# Patient Record
Sex: Male | Born: 1960 | Race: White | Hispanic: No | Marital: Married | Smoking: Never smoker
Health system: Southern US, Community
[De-identification: ages and names within clinical notes are randomized; demographics above are authoritative.]

## PROBLEM LIST (undated history)

## (undated) HISTORY — PX: HERNIA REPAIR: SHX51

---

## 2014-07-13 ENCOUNTER — Emergency Department
Admission: EM | Admit: 2014-07-13 | Discharge: 2014-07-13 | Disposition: A | Discharge status: Home / Self Care | Payer: Federal, State, Local not specified - PPO | Source: Home / Self Care | Attending: Family Medicine | Admitting: Family Medicine

## 2014-07-13 ENCOUNTER — Encounter: Payer: Self-pay | Admitting: *Deleted

## 2014-07-13 DIAGNOSIS — R6889 Other general symptoms and signs: Secondary | ICD-10-CM | POA: Diagnosis not present

## 2014-07-13 MED ORDER — BENZONATATE 200 MG PO CAPS: 200.0000 mg | ORAL_CAPSULE | Freq: Every day | ORAL | Status: DC

## 2014-07-13 MED ORDER — OSELTAMIVIR PHOSPHATE 75 MG PO CAPS: 75.0000 mg | ORAL_CAPSULE | Freq: Two times a day (BID) | ORAL | Status: DC

## 2014-07-13 NOTE — ED Provider Notes (Signed)
CSN: [id number]     Arrival date & time 07/13/14  1643 History   First MD Initiated Contact with Patient 07/13/14 1704     Chief Complaint  Patient presents with  . Fever  . Cough      HPI Comments: Complains of 3 day history of fatigue, and today developed flu-like illness including myalgias, headache, fever/chills, and cough.  Also has mild nasal congestion and sore throat.  Cough is non-productive.  No pleuritic pain or shortness of breath.  He has not had a flu shot this season.  He has had mild nausea without vomiting.  His wife and daughter have influenza.  The history is provided by the patient.    History reviewed. No pertinent past medical history. Past Surgical History  Procedure Laterality Date  . Hernia repair     Family History  Problem Relation Age of Onset  . Cancer Father     liver   History  Substance Use Topics  . Smoking status: Never Smoker   . Smokeless tobacco: Not on file  . Alcohol Use: No    Review of Systems No sore throat + hoarse + coughin + wheezing + nasal congestion + dizziness No pleuritic pain  + post-nasal drainage No sinus pain/pressure No itchy/red eyes No earache No hemoptysis + SOB + fever, + chills + nausea No vomiting + abdominal pain No diarrhea No urinary symptoms No skin rash + fatigue + myalgias + headache Used OTC meds without relief  Allergies  Review of patient's allergies indicates no known allergies.  Home Medications   Prior to Admission medications   Medication Sig Start Date End Date Taking? Authorizing Provider  benzonatate (TESSALON) 200 MG capsule Take 1 capsule (200 mg total) by mouth at bedtime. Take as needed for cough 07/13/14   Lattie Haw, MD  oseltamivir (TAMIFLU) 75 MG capsule Take 1 capsule (75 mg total) by mouth every 12 (twelve) hours. 07/13/14   Lattie Haw, MD   BP 162/91 mmHg  Pulse 69  Temp(Src) 98.6 F (37 C) (Oral)  Resp 18  Ht  (1.803 m)  Wt 193 lb (87.544 kg)   BMI 26.93 kg/m2  SpO2 98% Physical Exam Nursing notes and Vital Signs reviewed. Appearance:  Patient appears stated age, and in no acute distress Eyes:  Pupils are equal, round, and reactive to light and accomodation.  Extraocular movement is intact.  Conjunctivae are not inflamed  Ears:  Canals normal.  Tympanic membranes normal.  Nose:  Mildly congested turbinates.  No sinus tenderness.  Pharynx:  Normal Neck:  Supple.  Enlarged but nontender posterior nodes are palpated bilaterally  Lungs:  Clear to auscultation.  Breath sounds are equal.  Heart:  Regular rate and rhythm without murmurs, rubs, or gallops.  Abdomen:  Nontender without masses or hepatosplenomegaly.  Bowel sounds are present.  No CVA or flank tenderness.  Extremities:  No edema.  No calf tenderness Skin:  No rash present.   ED Course  Procedures  none     MDM   1. Influenza-like symptoms    Begin Tamiflu.  Prescription written for Benzonatate [facility]) to take at bedtime for night-time cough.  Take plain guaifenesin (  extended release tabs such as Mucinex) twice daily, with plenty of water, for cough and congestion.  May add Pseudoephedrine for sinus congestion.  Get adequate rest.   May use Afrin nasal spray (or generic oxymetazoline) twice daily for about 5 days.  Also recommend using saline nasal  spray several times daily and saline nasal irrigation (AYR is a common brand).   Try warm salt water gargles for sore throat.  Stop all antihistamines for now, and other non-prescription cough/cold preparations. May take Ibuprofen 200mg , 4 tabs every 8 hours with food for headache, body aches, etc.   Follow-up with family doctor if not improving about one week.    Lattie HawStephen A Crystalina Stodghill, MD 07/17/14 [phone]

## 2014-07-13 NOTE — Discharge Instructions (Signed)
Take plain guaifenesin (1200mg  extended release tabs such as Mucinex) twice daily, with plenty of water, for cough and congestion.  May add Pseudoephedrine for sinus congestion.  Get adequate rest.   May use Afrin nasal spray (or generic oxymetazoline) twice daily for about 5 days.  Also recommend using saline nasal spray several times daily and saline nasal irrigation (AYR is a common brand).   Try warm salt water gargles for sore throat.  Stop all antihistamines for now, and other non-prescription cough/cold preparations. May take Ibuprofen 200mg , 4 tabs every 8 hours with food for headache, body aches, etc.   Follow-up with family doctor if not improving about one week.

## 2014-07-13 NOTE — ED Notes (Signed)
Pt cough, fever, and body aches x today. Reports wife and daughter with flu.

## 2016-03-06 ENCOUNTER — Encounter: Payer: Self-pay | Admitting: *Deleted

## 2016-03-06 ENCOUNTER — Emergency Department
Admission: EM | Admit: 2016-03-06 | Discharge: 2016-03-06 | Disposition: A | Discharge status: Home / Self Care | Payer: Federal, State, Local not specified - PPO | Source: Home / Self Care | Attending: Family Medicine | Admitting: Family Medicine

## 2016-03-06 DIAGNOSIS — B9789 Other viral agents as the cause of diseases classified elsewhere: Secondary | ICD-10-CM

## 2016-03-06 DIAGNOSIS — J069 Acute upper respiratory infection, unspecified: Secondary | ICD-10-CM

## 2016-03-06 MED ORDER — PREDNISONE 20 MG PO TABS: ORAL_TABLET | ORAL | 0 refills | Status: DC

## 2016-03-06 NOTE — ED Provider Notes (Signed)
[facility]    CSN: [id number] Arrival date & time: 03/06/16  1453     History   Chief Complaint Chief Complaint  Patient presents with  . Nasal Congestion    HPI Jeremiah Richardson is a 55 y.o. male.   Patient complains of three day history of typical cold-like symptoms developing over several days, including sinus congestion, headache, fatigue, chills, myalgias, and cough.  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                                                                                                                                                                           The history is provided by the patient.    History reviewed. No pertinent past medical history.  There are no active problems to display for this patient.   Past Surgical History:  Procedure Laterality Date  . HERNIA REPAIR         Home Medications    Prior to Admission medications   Medication Sig Start Date End Date Taking? Authorizing Provider  predniSONE (DELTASONE) 20 MG tablet Take one tab by mouth twice daily for 5 days, then one daily. Take with food. 03/06/16   Lattie HawStephen A Beese, MD    Family History Family History  Problem Relation Age of Onset  . Cancer Father     liver  . Diabetes Father     Social History Social History  Substance Use Topics  . Smoking status: Never Smoker  . Smokeless tobacco: Never Used  . Alcohol use No     Allergies   Review of patient's allergies indicates no known allergies.   Review of Systems Review of Systems No sore throat + cough No pleuritic pain + wheezing + nasal congestion + post-nasal drainage No sinus pain/pressure No itchy/red eyes No earache No hemoptysis No SOB No fever, + chills No nausea No vomiting No abdominal pain No diarrhea No urinary symptoms No skin rash + fatigue + myalgias + headache    Physical Exam Triage  Vital Signs ED Triage Vitals  Enc Vitals Group     BP 03/06/16 1517 147/86     Pulse Rate 03/06/16 1517 (!) 56     Resp 03/06/16 1517 18     Temp 03/06/16 1517 97.5 F (36.4 C)     Temp Source 03/06/16 1517 Oral     SpO2 03/06/16 1517 99 %     Weight 03/06/16 1517 194 lb (88 kg)     Height 03/06/16 1517 5\' 11"  (1.803 m)     Head Circumference --      Peak Flow --  Pain Score 03/06/16 1518 0     Pain Loc --      Pain Edu? --      Excl. in GC? --    No data found.   Updated Vital Signs BP 147/86 (BP Location: Left Arm)   Pulse (!) 56   Temp 97.5 F (36.4 C) (Oral)   Resp 18   Ht 5\' 11"  (1.803 m)   Wt 194 lb (88 kg)   SpO2 99%   BMI 27.06 kg/m   Visual Acuity Right Eye Distance:   Left Eye Distance:   Bilateral Distance:    Right Eye Near:   Left Eye Near:    Bilateral Near:     Physical Exam Nursing notes and Vital Signs reviewed. Appearance:  Patient appears stated age, and in no acute distress Eyes:  Pupils are equal, round, and reactive to light and accomodation.  Extraocular movement is intact.  Conjunctivae are not inflamed  Ears:  Canals normal.  Tympanic membranes normal.  Nose:  Mildly congested turbinates.  No sinus tenderness.    Pharynx:  Normal Neck:  Supple.  Slightly tender enlarged posterior/lateral nodes are palpated bilaterally  Lungs:  Clear to auscultation.  Breath sounds are equal.  Moving air well. Heart:  Regular rate and rhythm without murmurs, rubs, or gallops.  Abdomen:  Nontender without masses or hepatosplenomegaly.  Bowel sounds are present.  No CVA or flank tenderness.  Extremities:  No edema.  Skin:  No rash present.    UC Treatments / Results  Labs (all labs ordered are listed, but only abnormal results are displayed) Labs Reviewed - No data to display  EKG  EKG Interpretation None       Radiology No results found.  Procedures Procedures (including critical care time)  Medications Ordered in UC Medications -  No data to display   Initial Impression / Assessment and Plan / UC Course  I have reviewed the triage vital signs and the nursing notes.  Pertinent labs & imaging results that were available during my care of the patient were reviewed by me and considered in my medical decision making (see chart for details).  Clinical Course  There is no evidence of bacterial infection today.   Begin prednisone burst/taper. Take plain guaifenesin (1200mg  extended release tabs such as Mucinex) twice daily, with plenty of water, for cough and congestion.  May add Pseudoephedrine (30mg , one or two every 4 to 6 hours) for sinus congestion.  Get adequate rest.   May use Afrin nasal spray (or generic oxymetazoline) twice daily for about 5 days and then discontinue.  Also recommend using saline nasal spray several times daily and saline nasal irrigation (AYR is a common brand).  Use Flonase nasal spray each morning after using Afrin nasal spray and saline nasal irrigation. Try warm salt water gargles for sore throat.  Stop all antihistamines for now, and other non-prescription cough/cold preparations. May take Delsym Cough Suppressant at bedtime for nighttime cough.  Followup with Family Doctor if not improved in about 10 days.     Final Clinical Impressions(s) / UC Diagnoses   Final diagnoses:  Viral URI with cough    New Prescriptions New Prescriptions   PREDNISONE (DELTASONE) 20 MG TABLET    Take one tab by mouth twice daily for 5 days, then one daily. Take with food.     Lattie HawStephen A Beese, MD 03/06/16 [phone]

## 2016-03-06 NOTE — ED Triage Notes (Signed)
Pt c/o watery eyes, nasal congestion and body aches x 3 days. Denies fever.

## 2016-03-06 NOTE — Discharge Instructions (Signed)
Take plain guaifenesin (1200mg extended release tabs such as Mucinex) twice daily, with plenty of water, for cough and congestion.  May add Pseudoephedrine (30mg, one or two every 4 to 6 hours) for sinus congestion.  Get adequate rest.   °May use Afrin nasal spray (or generic oxymetazoline) twice daily for about 5 days and then discontinue.  Also recommend using saline nasal spray several times daily and saline nasal irrigation (AYR is a common brand).  Use Flonase nasal spray each morning after using Afrin nasal spray and saline nasal irrigation. °Try warm salt water gargles for sore throat.  °Stop all antihistamines for now, and other non-prescription cough/cold preparations. °May take Delsym Cough Suppressant at bedtime for nighttime cough.  °Follow-up with family doctor if not improving about10 days.  °

## 2016-06-11 ENCOUNTER — Emergency Department
Admission: EM | Admit: 2016-06-11 | Discharge: 2016-06-11 | Disposition: A | Discharge status: Home / Self Care | Payer: Federal, State, Local not specified - PPO | Source: Home / Self Care | Attending: Family Medicine | Admitting: Family Medicine

## 2016-06-11 ENCOUNTER — Encounter: Payer: Self-pay | Admitting: Emergency Medicine

## 2016-06-11 DIAGNOSIS — J069 Acute upper respiratory infection, unspecified: Secondary | ICD-10-CM

## 2016-06-11 DIAGNOSIS — B9789 Other viral agents as the cause of diseases classified elsewhere: Secondary | ICD-10-CM | POA: Diagnosis not present

## 2016-06-11 MED ORDER — PREDNISONE 20 MG PO TABS: ORAL_TABLET | ORAL | 0 refills | Status: DC

## 2016-06-11 MED ORDER — OSELTAMIVIR PHOSPHATE 75 MG PO CAPS: 75.0000 mg | ORAL_CAPSULE | Freq: Two times a day (BID) | ORAL | 0 refills | Status: DC

## 2016-06-11 MED ORDER — FLUTICASONE PROPIONATE 50 MCG/ACT NA SUSP: 2.0000 | Freq: Every day | NASAL | 2 refills | Status: DC

## 2016-06-11 MED ORDER — ALBUTEROL SULFATE HFA 108 (90 BASE) MCG/ACT IN AERS: 1.0000 | INHALATION_SPRAY | Freq: Four times a day (QID) | RESPIRATORY_TRACT | 0 refills | Status: DC | PRN

## 2016-06-11 NOTE — ED Provider Notes (Signed)
CSN: [id number]     Arrival date & time 06/11/16  0807 History   First MD Initiated Contact with Patient 06/11/16 0920     Chief Complaint  Patient presents with  . Cough   (Consider location/radiation/quality/duration/timing/severity/associated sxs/prior Treatment) HPI Jeremiah Richardson is a 56 y.o. male presenting to UC with c/o dry cough, body aches, and fullness in his chest that started yesterday. Symptoms are mild at this time but he had a temp of 99.6*F this morning, his wife insisted he be seen to make sure symptoms don't worsen. Denies n/v/d. 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 No specific known sick contacts but others at work have been out due to strep and the flu.   History reviewed. No pertinent past medical history. Past Surgical History:  Procedure Laterality Date  . HERNIA REPAIR     Family History  Problem Relation Age of Onset  . Cancer Father     liver  . Diabetes Father    Social History  Substance Use Topics  . Smoking status: Never Smoker  . Smokeless tobacco: Never Used  . Alcohol use No    Review of Systems  Constitutional: Positive for fever ( low grade). Negative for chills.  HENT: Positive for congestion. Negative for ear pain, sore throat, trouble swallowing and voice change.   Respiratory: Positive for cough. Negative for shortness of breath.   Cardiovascular: Negative for chest pain and palpitations.  Gastrointestinal: Negative for abdominal pain, diarrhea, nausea and vomiting.  Musculoskeletal: Positive for arthralgias, back pain and myalgias.       Body aches  Skin: Negative for rash.  Neurological: Positive for headaches. Negative for dizziness and light-headedness.    Allergies  Patient has no known allergies.  Home Medications   Prior to Admission medications   Medication Sig Start Date End Date  Taking? Authorizing Provider  albuterol (PROVENTIL HFA;VENTOLIN HFA) 108 (90 Base) MCG/ACT inhaler Inhale 1-2 puffs into the lungs every 6 (six) hours as needed for wheezing or shortness of breath. 06/11/16   Junius FinnerErin O'Malley, PA-C  fluticasone (FLONASE) 50 MCG/ACT nasal spray Place 2 sprays into both nostrils daily. 06/11/16   Junius FinnerErin O'Malley, PA-C  oseltamivir (TAMIFLU) 75 MG capsule Take 1 capsule (75 mg total) by mouth every 12 (twelve) hours. 06/11/16   Junius FinnerErin O'Malley, PA-C  predniSONE (DELTASONE) 20 MG tablet Take one tab by mouth twice daily for 5 days, then one daily. Take with food. 03/06/16   Lattie HawStephen A Beese, MD  predniSONE (DELTASONE) 20 MG tablet 3 tabs po day one, then 2 po daily x 4 days 06/11/16   Junius FinnerErin O'Malley, PA-C   Meds Ordered and Administered this Visit  Medications - No data to display  BP 167/92 (BP Location: Right Arm)   Pulse 76   Temp 98.3 F (36.8 C) (Oral)   Wt 196 lb (88.9 kg)   SpO2 99%   BMI 27.34 kg/m  No data found.   Physical Exam  Constitutional: He is oriented to person, place, and time. He appears well-developed and well-nourished. No distress.  HENT:  Head: Normocephalic and atraumatic.  Right Ear: Tympanic membrane normal.  Left Ear: Tympanic membrane normal.  Nose: Nose normal.  Mouth/Throat: Uvula is midline, oropharynx is clear and moist and mucous membranes are normal.  Eyes: EOM are normal.  Neck: Normal  range of motion. Neck supple.  Cardiovascular: Normal rate and regular rhythm.   Pulmonary/Chest: Effort normal and breath sounds normal. No stridor. No respiratory distress. He has no wheezes. He has no rales.  Musculoskeletal: Normal range of motion.  Lymphadenopathy:    He has no cervical adenopathy.  Neurological: He is alert and oriented to person, place, and time.  Skin: Skin is warm and dry. He is not diaphoretic.  Psychiatric: He has a normal mood and affect. His behavior is normal.  Nursing note and vitals reviewed.   Urgent Care Course      Procedures (including critical care time)  Labs Review Labs Reviewed - No data to display  Imaging Review No results found.   MDM   1. Viral URI with cough    Pt presenting to UC with URI symptoms that started yesterday.  Pt notes he does have mild body aches but no high fever or productive cough.   Symptoms could be due to viral illness such as common cold, however, given time of year and busy flu season, discussed risk/benefits of Tamiflu for potential early onset influenza.    Pt agreeable to try if symptoms worsen tomorrow. Encouraged fluids, rest, acetaminophen, ibuprofen and mucinex.  Due to hx of reactive airway- Rx: Prednisone and albuterol F/u with PCP in 1 week if not improving.   Junius Finner, PA-C 06/11/16 1018

## 2016-06-11 NOTE — Discharge Instructions (Signed)

## 2016-06-11 NOTE — ED Triage Notes (Signed)
Pt c/o dry cough, body aches and chest feels full. This started yesterday.

## 2016-06-13 ENCOUNTER — Telehealth: Payer: Self-pay | Admitting: Emergency Medicine

## 2016-06-13 NOTE — Telephone Encounter (Signed)
Called to report higher fever today; wanting to know if too late to get tamiflu rx filled. I suggested he do so and start immediately per directions.

## 2017-01-20 ENCOUNTER — Ambulatory Visit: Payer: Federal, State, Local not specified - PPO | Admitting: Physician Assistant

## 2017-02-25 ENCOUNTER — Emergency Department
Admission: EM | Admit: 2017-02-25 | Discharge: 2017-02-25 | Disposition: A | Discharge status: Home / Self Care | Payer: Federal, State, Local not specified - PPO | Source: Home / Self Care | Attending: Family Medicine | Admitting: Family Medicine

## 2017-02-25 ENCOUNTER — Encounter: Payer: Self-pay | Admitting: *Deleted

## 2017-02-25 DIAGNOSIS — R1033 Periumbilical pain: Secondary | ICD-10-CM

## 2017-02-25 LAB — POCT CBC W AUTO DIFF (K'VILLE URGENT CARE)

## 2017-02-25 MED ORDER — CIPROFLOXACIN HCL 500 MG PO TABS: 500.0000 mg | ORAL_TABLET | Freq: Two times a day (BID) | ORAL | 0 refills | Status: DC

## 2017-02-25 MED ORDER — METRONIDAZOLE 500 MG PO TABS: 500.0000 mg | ORAL_TABLET | Freq: Three times a day (TID) | ORAL | 0 refills | Status: DC

## 2017-02-25 NOTE — Discharge Instructions (Signed)
Begin clear liquids for about 24 hours, then may begin a BRAT diet (Bananas, Rice, Applesauce, Toast) when abdominal pain improved.  Then gradually advance to a regular diet as tolerated.  Avoid milk products until well.   If symptoms become significantly worse during the night or over the weekend, proceed to the local emergency room.

## 2017-02-25 NOTE — ED Provider Notes (Signed)
[facility]    CSN: [id number] Arrival date & time: 02/25/17  1231     History   Chief Complaint Chief Complaint  Patient presents with  . Diarrhea  . Nasal Congestion    HPI Jeremiah Richardson is a 56 y.o. male.   Patient developed a mild sore throat, sinus congestion, chills/sweats, and myalgias four days ago.  His sore throat and sinus congestion resolved.  Last night before work he developed loose stools and took Imodium at about 7pm.  He later developed lower abdominal pain that has now improved.  He had nausea this morning without vomiting.  His diarrhea has resolved. No urinary symptoms.  He states that his daughter had similar symptoms about one week ago.   The history is provided by the patient.  Diarrhea  Quality:  Watery Severity:  Mild Onset quality:  Sudden Timing:  Intermittent Progression:  Resolved Relieved by:  Anti-motility medications Worsened by:  Nothing Ineffective treatments:  None tried Associated symptoms: abdominal pain, chills, diaphoresis, headaches and myalgias   Associated symptoms: no arthralgias, no recent cough and no vomiting   Risk factors: sick contacts     History reviewed. No pertinent past medical history.  There are no active problems to display for this patient.   Past Surgical History:  Procedure Laterality Date  . HERNIA REPAIR         Home Medications    Prior to Admission medications   Medication Sig Start Date End Date Taking? Authorizing Provider  ciprofloxacin (CIPRO) 500 MG tablet Take 1 tablet (500 mg total) by mouth 2 (two) times daily. 02/25/17   Lattie Haw, MD  fluticasone (FLONASE) 50 MCG/ACT nasal spray Place 2 sprays into both nostrils daily. 06/11/16   Lurene Shadow, PA-C  metroNIDAZOLE (FLAGYL) 500 MG tablet Take 1 tablet (500 mg total) by mouth 3 (three) times daily. 02/25/17   Lattie Haw, MD    Family History Family History  Problem Relation Age of Onset  . Cancer Father    liver  . Diabetes Father     Social History Social History  Substance Use Topics  . Smoking status: Never Smoker  . Smokeless tobacco: Never Used  . Alcohol use No     Allergies   Patient has no known allergies.   Review of Systems Review of Systems  Constitutional: Positive for chills and diaphoresis.  Gastrointestinal: Positive for abdominal pain and diarrhea. Negative for vomiting.  Musculoskeletal: Positive for myalgias. Negative for arthralgias.  Neurological: Positive for headaches.  All other systems reviewed and are negative.    Physical Exam Triage Vital Signs ED Triage Vitals [02/25/17 1253]  Enc Vitals Group     BP (!) 151/91     Pulse Rate 85     Resp 14     Temp 99.1 F (37.3 C)     Temp src      SpO2 99 %     Weight 196 lb (88.9 kg)     Height      Head Circumference      Peak Flow      Pain Score 5     Pain Loc      Pain Edu?      Excl. in GC?    No data found.   Updated Vital Signs BP (!) 151/91 (BP Location: Left Arm)   Pulse 85   Temp 99.1 F (37.3 C)   Resp 14   Wt 196 lb (88.9 kg)  SpO2 99%   BMI 27.34 kg/m   Visual Acuity Right Eye Distance:   Left Eye Distance:   Bilateral Distance:    Right Eye Near:   Left Eye Near:    Bilateral Near:     Physical Exam  Constitutional: He appears well-developed and well-nourished. No distress.  HENT:  Head: Normocephalic.  Right Ear: External ear normal.  Left Ear: External ear normal.  Nose: Nose normal.  Mouth/Throat: Oropharynx is clear and moist.  Eyes: Pupils are equal, round, and reactive to light. Conjunctivae are normal.  Neck: Neck supple.  Cardiovascular: Normal heart sounds.   Pulmonary/Chest: Breath sounds normal.  Abdominal: Soft. Bowel sounds are normal. He exhibits no distension and no mass. There is no hepatosplenomegaly. There is tenderness in the periumbilical area. There is no rigidity, no guarding, no CVA tenderness, no tenderness at McBurney's point and  negative Murphy's sign. No hernia.    Musculoskeletal: He exhibits no edema.  Lymphadenopathy:    He has no cervical adenopathy.  Neurological: He is alert.  Skin: Skin is warm and dry. No rash noted.  Nursing note and vitals reviewed.    UC Treatments / Results  Labs (all labs ordered are listed, but only abnormal results are displayed) Labs Reviewed  POCT CBC W AUTO DIFF ([facility]):  WBC 14.1; LY 20.0; MO 1.7; GR 78.3; Hgb 15.9; Platelets 166     EKG  EKG Interpretation None       Radiology No results found.  Procedures Procedures (including critical care time)  Medications Ordered in UC Medications - No data to display   Initial Impression / Assessment and Plan / UC Course  I have reviewed the triage vital signs and the nursing notes.  Pertinent labs & imaging results that were available during my care of the patient were reviewed by me and considered in my medical decision making (see chart for details).    Note leukocytosis 14.1.  Begin empiric Cipro and Flagyl. Begin clear liquids for about 24 hours, then may begin a BRAT diet (Bananas, Rice, Applesauce, Toast) when abdominal pain improved.  Then gradually advance to a regular diet as tolerated.  Avoid milk products until well.   If symptoms become significantly worse during the night or over the weekend, proceed to the local emergency room.  Return in 48 hours to repeat CBC.  If not improving, schedule CT abdomen. Patient has not yet had screening colonoscopy.  Recommend follow-up when well with GI for colonoscopy.    Final Clinical Impressions(s) / UC Diagnoses   Final diagnoses:  Periumbilical abdominal pain    New Prescriptions New Prescriptions   CIPROFLOXACIN (CIPRO) 500 MG TABLET    Take 1 tablet (500 mg total) by mouth 2 (two) times daily.   METRONIDAZOLE (FLAGYL) 500 MG TABLET    Take 1 tablet (500 mg total) by mouth 3 (three) times daily.        Lattie HawBeese, Kherington Meraz A, MD 02/25/17  [phone]

## 2017-02-25 NOTE — ED Triage Notes (Signed)
Patient c/o 3 days of nasal congestion and HA. Diarrhea since yesterday. Took one imodium and 10 hours later developed severe abdominal pain that is now a 5/10.

## 2017-02-27 ENCOUNTER — Emergency Department
Admission: EM | Admit: 2017-02-27 | Discharge: 2017-02-27 | Disposition: A | Discharge status: Home / Self Care | Payer: Federal, State, Local not specified - PPO | Source: Home / Self Care

## 2017-02-27 LAB — POCT CBC W AUTO DIFF (K'VILLE URGENT CARE)

## 2017-02-27 NOTE — ED Triage Notes (Addendum)
Patient is here fore repeat CBC per Dr. Rolla PlateBeese's request. Reports he is much improved. Minimal pain at times. Appetite is normal but eating bland foods.

## 2017-05-25 ENCOUNTER — Emergency Department (INDEPENDENT_AMBULATORY_CARE_PROVIDER_SITE_OTHER)
Admission: EM | Admit: 2017-05-25 | Discharge: 2017-05-25 | Disposition: A | Discharge status: Home / Self Care | Payer: Federal, State, Local not specified - PPO | Source: Home / Self Care | Attending: Family Medicine | Admitting: Family Medicine

## 2017-05-25 ENCOUNTER — Encounter: Payer: Self-pay | Admitting: Emergency Medicine

## 2017-05-25 ENCOUNTER — Emergency Department (INDEPENDENT_AMBULATORY_CARE_PROVIDER_SITE_OTHER): Payer: Federal, State, Local not specified - PPO

## 2017-05-25 DIAGNOSIS — M1712 Unilateral primary osteoarthritis, left knee: Secondary | ICD-10-CM | POA: Diagnosis not present

## 2017-05-25 DIAGNOSIS — M7050 Other bursitis of knee, unspecified knee: Secondary | ICD-10-CM

## 2017-05-25 NOTE — Discharge Instructions (Signed)
Wear Ace wrap or knee brace.  Apply ice pack for 20 to 30 minutes, 3 to 4 times daily  Continue until pain and swelling decrease.  May take Ibuprofen 200mg , 4 tabs every 8 hours with food.  Begin range of motion and stretching exercises as tolerated.

## 2017-05-25 NOTE — ED Triage Notes (Signed)
Patient states that he is having left knee pain x 2 weeks after vaccuming out car when standing up.  Patient has applied ice and a knee sleeve with minimal relief, also taking Ibuprofen.

## 2017-05-25 NOTE — ED Provider Notes (Signed)
[facility]    CSN: [id number] Arrival date & time: 05/25/17  1133     History   Chief Complaint Chief Complaint  Patient presents with  . Knee Pain    HPI Jeremiah Richardson is a 57 y.o. male.   Patient complains of persistent medial pain in his left knee for 2 weeks.  The pain started after he was vacuuming his car.  His knee occasionally feels as if it may lock or give way.  He recalls no injury or change in physical activities.  He has had minimal improvement when wearing a knee sleeve, applying ice packs, and taking ibuprofen.   The history is provided by the patient.  Knee Pain  Location:  Knee Time since incident:  2 weeks Injury: no   Knee location:  L knee Pain details:    Quality:  Aching   Radiates to:  Does not radiate   Severity:  Mild   Onset quality:  Sudden   Duration:  2 weeks   Timing:  Constant   Progression:  Unchanged Chronicity:  New Prior injury to area:  No Relieved by:  Nothing Worsened by:  Activity and exercise Ineffective treatments:  NSAIDs and ice Associated symptoms: no back pain, no decreased ROM, no muscle weakness, no stiffness and no swelling     History reviewed. No pertinent past medical history.  There are no active problems to display for this patient.   Past Surgical History:  Procedure Laterality Date  . HERNIA REPAIR         Home Medications    Prior to Admission medications   Not on File    Family History Family History  Problem Relation Age of Onset  . Cancer Father        liver  . Diabetes Father     Social History Social History   Tobacco Use  . Smoking status: Never Smoker  . Smokeless tobacco: Never Used  Substance Use Topics  . Alcohol use: No  . Drug use: No     Allergies   Patient has no known allergies.   Review of Systems Review of Systems  Musculoskeletal: Negative for back pain and stiffness.  All other systems reviewed and are negative.    Physical Exam Triage  Vital Signs ED Triage Vitals [05/25/17 1148]  Enc Vitals Group     BP (!) 144/91     Pulse Rate 68     Resp      Temp 98.1 F (36.7 C)     Temp Source Oral     SpO2 98 %     Weight 198 lb 4 oz (89.9 kg)     Height 5\' 11"  (1.803 m)     Head Circumference      Peak Flow      Pain Score 8     Pain Loc      Pain Edu?      Excl. in GC?    No data found.  Updated Vital Signs BP (!) 144/91 (BP Location: Right Arm)   Pulse 68   Temp 98.1 F (36.7 C) (Oral)   Ht 5\' 11"  (1.803 m)   Wt 198 lb 4 oz (89.9 kg)   SpO2 98%   BMI 27.65 kg/m   Visual Acuity Right Eye Distance:   Left Eye Distance:   Bilateral Distance:    Right Eye Near:   Left Eye Near:    Bilateral Near:     Physical Exam  Constitutional: He appears well-developed and well-nourished. No distress.  HENT:  Head: Normocephalic.  Eyes: Pupils are equal, round, and reactive to light.  Neck: Normal range of motion.  Cardiovascular: Normal rate.  Pulmonary/Chest: Effort normal.  Musculoskeletal:       Left knee: He exhibits normal range of motion, no swelling, no effusion, no ecchymosis, no deformity, no laceration, no erythema, normal alignment, no LCL laxity and normal patellar mobility. Tenderness found.       Legs: Left knee:  No effusion, erythema, or warmth.  Knee stable, negative drawer test.  McMurray test negative.  Tenderness over left pes anserine bursa.  Neurological: He is alert.  Skin: Skin is warm and dry.  Nursing note and vitals reviewed.    UC Treatments / Results  Labs (all labs ordered are listed, but only abnormal results are displayed) Labs Reviewed - No data to display  EKG  EKG Interpretation None       Radiology Dg Knee Complete 4 Views Left  Result Date: 05/25/2017 CLINICAL DATA:  57 year old male with history of left fifth knee pain for the past 2 weeks. EXAM: LEFT KNEE - COMPLETE 4+ VIEW COMPARISON:  No priors. FINDINGS: Four views of the left knee demonstrate no acute  displaced fracture, subluxation or dislocation. Mild joint space narrowing, subchondral sclerosis and osteophyte formation in a tricompartmental distribution, compatible with mild tricompartmental osteoarthritis. IMPRESSION: 1. No acute radiographic abnormality of the left knee. 2. Mild tricompartmental osteoarthritis. Electronically Signed   By: Trudie Reed M.D.   On: 05/25/2017 13:07    Procedures Procedures (including critical care time)  Medications Ordered in UC Medications - No data to display   Initial Impression / Assessment and Plan / UC Course  I have reviewed the triage vital signs and the nursing notes.  Pertinent labs & imaging results that were available during my care of the patient were reviewed by me and considered in my medical decision making (see chart for details).    Ace wrap applied. Wear Ace wrap or knee brace.  Apply ice pack for 20 to 30 minutes, 3 to 4 times daily  Continue until pain and swelling decrease.  May take Ibuprofen 200mg , 4 tabs every 8 hours with food.  Begin range of motion and stretching exercises as tolerated. Followup with Dr. Rodney Langton or Dr. Clementeen [facility] ([facility]) if not improving about two weeks.     Final Clinical Impressions(s) / UC Diagnoses   Final diagnoses:  Pes anserine bursitis    ED Discharge Orders    None           Lattie Haw, MD 05/30/17 2143

## 2017-06-06 ENCOUNTER — Emergency Department
Admission: EM | Admit: 2017-06-06 | Discharge: 2017-06-06 | Disposition: A | Discharge status: Home / Self Care | Payer: Federal, State, Local not specified - PPO | Source: Home / Self Care | Attending: Family Medicine | Admitting: Family Medicine

## 2017-06-06 ENCOUNTER — Other Ambulatory Visit: Payer: Self-pay

## 2017-06-06 ENCOUNTER — Emergency Department (INDEPENDENT_AMBULATORY_CARE_PROVIDER_SITE_OTHER): Payer: Federal, State, Local not specified - PPO

## 2017-06-06 DIAGNOSIS — R05 Cough: Secondary | ICD-10-CM

## 2017-06-06 DIAGNOSIS — B9789 Other viral agents as the cause of diseases classified elsewhere: Secondary | ICD-10-CM | POA: Diagnosis not present

## 2017-06-06 DIAGNOSIS — R509 Fever, unspecified: Secondary | ICD-10-CM | POA: Diagnosis not present

## 2017-06-06 DIAGNOSIS — J069 Acute upper respiratory infection, unspecified: Secondary | ICD-10-CM | POA: Diagnosis not present

## 2017-06-06 LAB — POCT INFLUENZA A/B
Influenza A, POC: NEGATIVE
Influenza B, POC: NEGATIVE

## 2017-06-06 MED ORDER — AZITHROMYCIN 250 MG PO TABS: ORAL_TABLET | ORAL | 0 refills | Status: DC

## 2017-06-06 NOTE — ED Triage Notes (Signed)
Started yesterday afternoon.  Feverish during the night, productive cough with greenish mucous.   Had a chest cold last week, but felt better after.

## 2017-06-06 NOTE — Discharge Instructions (Signed)
Take plain guaifenesin (1200mg  extended release tabs such as Mucinex) twice daily, with plenty of water, for cough and congestion.  May add Pseudoephedrine (30mg , one or two every 4 to 6 hours) for sinus congestion.  Get adequate rest.   May use Afrin nasal spray (or generic oxymetazoline) each morning for about 5 days and then discontinue.  Also recommend using saline nasal spray several times daily and saline nasal irrigation (AYR is a common brand).  Use Flonase nasal spray each morning after using Afrin nasal spray and saline nasal irrigation. Try warm salt water gargles for sore throat.  Stop all antihistamines for now, and other non-prescription cough/cold preparations. May take Delsym Cough Suppressant at bedtime for nighttime cough.  Begin Azithromycin if not improving about 5 days or if persistent fever develops   Follow-up with family doctor if not improving about10 days.

## 2017-06-06 NOTE — ED Provider Notes (Signed)
[facility]    CSN: [id number] Arrival date & time: 06/06/17  0830     History   Chief Complaint Chief Complaint  Patient presents with  . Cough  . Fever  . Generalized Body Aches  . Headache    HPI Jeremiah ConverseKim Richardson is a 57 y.o. male.   Patient reports that he had a "chest cold" two weeks ago, and after one week felt significantly better.  Yesterday he developed myalgias, fever, headache, mild sore throat, sneezing, new cough, and nasal congestion.   The history is provided by the patient.    History reviewed. No pertinent past medical history.  There are no active problems to display for this patient.   Past Surgical History:  Procedure Laterality Date  . HERNIA REPAIR         Home Medications    Prior to Admission medications   Medication Sig Start Date End Date Taking? Authorizing Provider  azithromycin (ZITHROMAX Z-PAK) 250 MG tablet Take 2 tabs today; then begin one tab once daily for 4 more days. (Rx void after 06/14/17) 06/06/17   Lattie HawBeese, Azaliah Carrero A, MD    Family History Family History  Problem Relation Age of Onset  . Cancer Father        liver  . Diabetes Father     Social History Social History   Tobacco Use  . Smoking status: Never Smoker  . Smokeless tobacco: Never Used  Substance Use Topics  . Alcohol use: No  . Drug use: No     Allergies   Patient has no known allergies.   Review of Systems Review of Systems ? sore throat + cough + sneezing No pleuritic pain No wheezing + nasal congestion + post-nasal drainage No sinus pain/pressure No itchy/red eyes No earache No hemoptysis No SOB + fever, + chills + nausea No vomiting No abdominal pain No diarrhea No urinary symptoms No skin rash + fatigue + myalgias + headache Used OTC meds without relief   Physical Exam Triage Vital Signs ED Triage Vitals  Enc Vitals Group     BP 06/06/17 0851 (!) 154/92     Pulse Rate 06/06/17 0851 74     Resp --      Temp  06/06/17 0851 100.1 F (37.8 C)     Temp Source 06/06/17 0851 Oral     SpO2 06/06/17 0851 98 %     Weight 06/06/17 0852 196 lb (88.9 kg)     Height 06/06/17 0852 5\' 11"  (1.803 m)     Head Circumference --      Peak Flow --      Pain Score 06/06/17 0852 0     Pain Loc --      Pain Edu? --      Excl. in GC? --    No data found.  Updated Vital Signs BP (!) 154/92 (BP Location: Right Arm)   Pulse 74   Temp 100.1 F (37.8 C) (Oral)   Ht 5\' 11"  (1.803 m)   Wt 196 lb (88.9 kg)   SpO2 98%   BMI 27.34 kg/m   Visual Acuity Right Eye Distance:   Left Eye Distance:   Bilateral Distance:    Right Eye Near:   Left Eye Near:    Bilateral Near:     Physical Exam Nursing notes and Vital Signs reviewed. Appearance:  Patient appears stated age, and in no acute distress Eyes:  Pupils are equal, round, and reactive to light and accomodation.  Extraocular movement is intact.  Conjunctivae are not inflamed  Ears:  Canals normal.  Tympanic membranes normal.  Nose:  Mildly congested turbinates.  No sinus tenderness. Pharynx:  Normal Neck:  Supple.  Enlarged posterior/lateral nodes are palpated bilaterally, tender to palpation on the left.   Lungs:  Clear to auscultation.  Breath sounds are equal.  Moving air well. Heart:  Regular rate and rhythm without murmurs, rubs, or gallops.  Abdomen:  Nontender without masses or hepatosplenomegaly.  Bowel sounds are present.  No CVA or flank tenderness.  Extremities:  No edema.  Skin:  No rash present.    UC Treatments / Results  Labs (all labs ordered are listed, but only abnormal results are displayed) Labs Reviewed  POCT INFLUENZA A/B negative    EKG  EKG Interpretation None       Radiology Dg Chest 2 View  Result Date: 06/06/2017 CLINICAL DATA:  Cough and fever. EXAM: CHEST  2 VIEW COMPARISON:  No recent prior. FINDINGS: Mediastinum and hilar structures normal. Lungs are clear. No pleural effusion or pneumothorax. Heart size normal.  No acute bony abnormality. IMPRESSION: No acute cardiopulmonary disease. Electronically Signed   By: Maisie Fus  Register   On: 06/06/2017 10:17    Procedures Procedures (including critical care time)  Medications Ordered in UC Medications - No data to display   Initial Impression / Assessment and Plan / UC Course  I have reviewed the triage vital signs and the nursing notes.  Pertinent labs & imaging results that were available during my care of the patient were reviewed by me and considered in my medical decision making (see chart for details).    There is no evidence of bacterial infection today.  Suspect a new onset viral URI. Treat symptomatically for now  Take plain guaifenesin (1200mg  extended release tabs such as Mucinex) twice daily, with plenty of water, for cough and congestion.  May add Pseudoephedrine (30mg , one or two every 4 to 6 hours) for sinus congestion.  Get adequate rest.   May use Afrin nasal spray (or generic oxymetazoline) each morning for about 5 days and then discontinue.  Also recommend using saline nasal spray several times daily and saline nasal irrigation (AYR is a common brand).  Use Flonase nasal spray each morning after using Afrin nasal spray and saline nasal irrigation. Try warm salt water gargles for sore throat.  Stop all antihistamines for now, and other non-prescription cough/cold preparations. May take Delsym Cough Suppressant at bedtime for nighttime cough.  Begin Azithromycin if not improving about 5 days or if persistent fever develops (Given a prescription to hold, with an expiration date)  Follow-up with family doctor if not improving about10 days.     Final Clinical Impressions(s) / UC Diagnoses   Final diagnoses:  Viral URI with cough    ED Discharge Orders        Ordered    azithromycin (ZITHROMAX Z-PAK) 250 MG tablet     06/06/17 1033         Lattie Haw, MD 06/10/17 [phone]

## 2018-11-04 IMAGING — DX DG CHEST 2V
2 series · 2 of 2 positions shown · non-contrast
Comparison: No recent prior.

CLINICAL DATA: Cough and fever.

EXAM:
CHEST  2 VIEW

[chest pa]
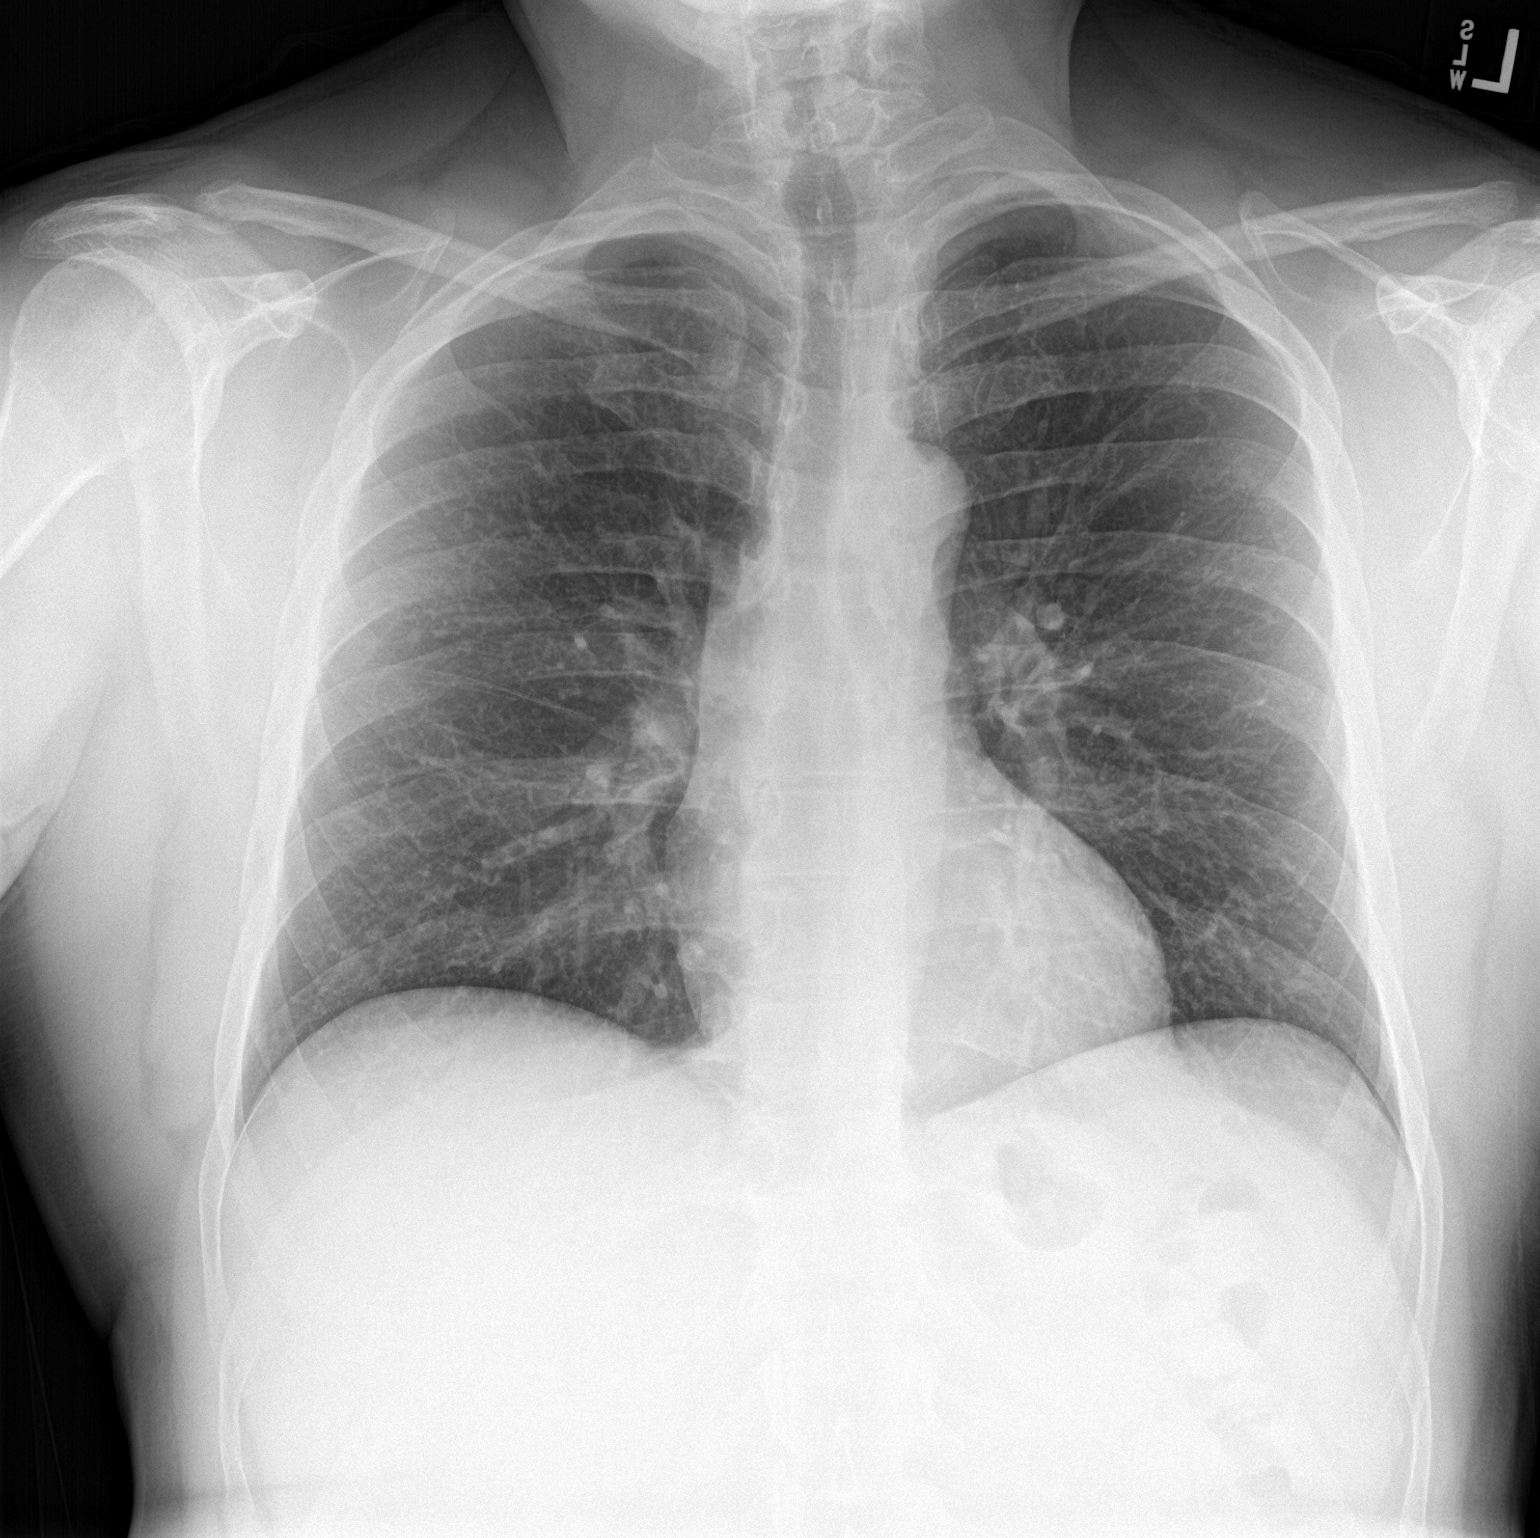

[chest lat]
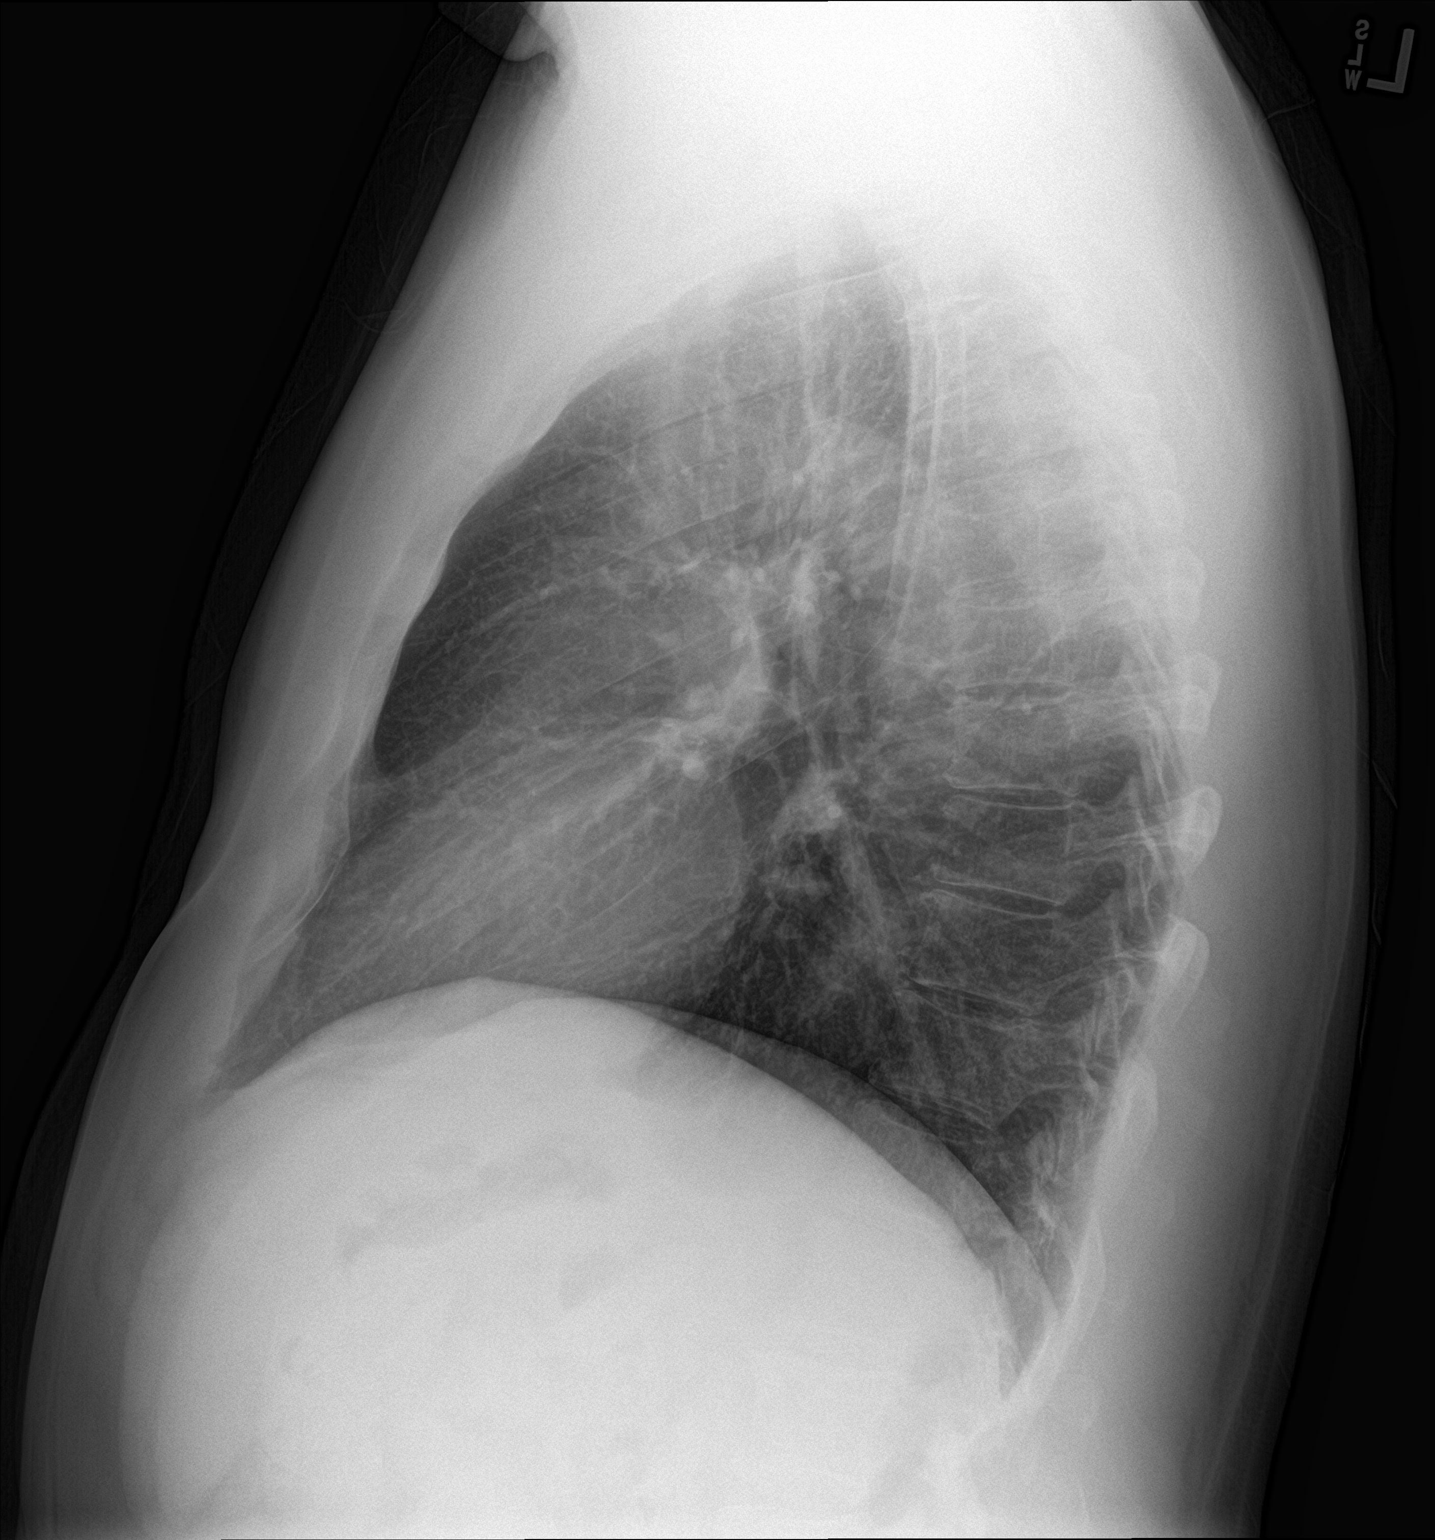

[2 of 2 positions shown; findings below may reference images not displayed]

FINDINGS: Mediastinum and hilar structures normal. Lungs are clear. No pleural
effusion or pneumothorax. Heart size normal. No acute bony
abnormality.
IMPRESSION: No acute cardiopulmonary disease.

## 2019-10-18 ENCOUNTER — Ambulatory Visit: Payer: Self-pay

## 2022-04-12 ENCOUNTER — Encounter: Payer: Self-pay | Admitting: Emergency Medicine

## 2022-04-12 ENCOUNTER — Ambulatory Visit
Admission: EM | Admit: 2022-04-12 | Discharge: 2022-04-12 | Disposition: A | Discharge status: Home / Self Care | Payer: Federal, State, Local not specified - PPO | Attending: Family Medicine | Admitting: Family Medicine

## 2022-04-12 DIAGNOSIS — A084 Viral intestinal infection, unspecified: Secondary | ICD-10-CM

## 2022-04-12 NOTE — ED Provider Notes (Signed)
[facility]    CSN: [id number] Arrival date & time: 04/12/22  1132      History   Chief Complaint Chief Complaint  Patient presents with   Diarrhea    HPI Jeremiah Richardson is a 61 y.o. male.   Three days ago patient suddenly developed nausea/vomiting, diarrhea, and fatigue.  He had a low grade fever that lasted 24 hours.  He feels better and his nausea/vomiting have resolved but he is still having loose stools today.  He denies recent foreign travel, or drinking untreated water in a wilderness environment.  He denies recent antibiotic use.   The history is provided by the patient.    History reviewed. No pertinent past medical history.  There are no problems to display for this patient.   Past Surgical History:  Procedure Laterality Date   HERNIA REPAIR         Home Medications    Prior to Admission medications   Not on File    Family History Family History  Problem Relation Age of Onset   Cancer Father        liver   Diabetes Father     Social History Social History   Tobacco Use   Smoking status: Never   Smokeless tobacco: Current  Vaping Use   Vaping Use: Every day  Substance Use Topics   Alcohol use: No   Drug use: No     Allergies   Patient has no known allergies.   Review of Systems Review of Systems No sore throat No cough No pleuritic pain No wheezing No nasal congestion No post-nasal drainage No sinus pain/pressure No itchy/red eyes No earache No hemoptysis No SOB + fever + nausea + vomiting No abdominal pain + diarrhea No urinary symptoms No skin rash + fatigue No myalgias No headache   Physical Exam Triage Vital Signs ED Triage Vitals  Enc Vitals Group     BP 04/12/22 1222 (!) 174/99     Pulse Rate 04/12/22 1222 (!) 57     Resp 04/12/22 1222 18     Temp 04/12/22 1222 98.4 F (36.9 C)     Temp Source 04/12/22 1222 Oral     SpO2 04/12/22 1222 99 %     Weight 04/12/22 1224 205 lb (93 kg)     Height  04/12/22 1224 5\' 10"  (1.778 m)     Head Circumference --      Peak Flow --      Pain Score 04/12/22 1224 0     Pain Loc --      Pain Edu? --      Excl. in GC? --    No data found.  Updated Vital Signs BP (!) 174/99 (BP Location: Right Arm)   Pulse (!) 57   Temp 98.4 F (36.9 C) (Oral)   Resp 18   Ht 5\' 10"  (1.778 m)   Wt 93 kg   SpO2 99%   BMI 29.41 kg/m   Visual Acuity Right Eye Distance:   Left Eye Distance:   Bilateral Distance:    Right Eye Near:   Left Eye Near:    Bilateral Near:     Physical Exam Nursing notes and Vital Signs reviewed. Appearance:  Patient appears stated age, and in no acute distress.    Eyes:  Pupils are equal, round, and reactive to light and accomodation.  Extraocular movement is intact.  Conjunctivae are not inflamed   Pharynx:  Normal; moist mucous membranes  Neck:  Supple.  No adenopathy Lungs:  Clear to auscultation.  Breath sounds are equal.  Moving air well. Heart:  Regular rate and rhythm without murmurs, rubs, or gallops.  Abdomen:  Nontender without masses or hepatosplenomegaly.  Bowel sounds are present and increeased.  No CVA or flank tenderness.  Extremities:  No edema.  Skin:  No rash present.     UC Treatments / Results  Labs (all labs ordered are listed, but only abnormal results are displayed) Labs Reviewed - No data to display  EKG   Radiology No results found.  Procedures Procedures (including critical care time)  Medications Ordered in UC Medications - No data to display  Initial Impression / Assessment and Plan / UC Course  I have reviewed the triage vital signs and the nursing notes.  Pertinent labs & imaging results that were available during my care of the patient were reviewed by me and considered in my medical decision making (see chart for details).    Benign exam.  Treat symptomatically for now  Followup with Family Doctor if not improved in one week.   Final Clinical Impressions(s) / UC  Diagnoses   Final diagnoses:  Viral gastroenteritis     Discharge Instructions      Begin Pedialyte for about 8 hours until diarrhea stops, then switch to clear liquids (apple juice, clear grape juice, Jello, etc) for about 6 to 8 hours.  When improved, advance to a [employer] (Bananas, Rice, Applesauce, Toast). Then gradually resume a regular diet when tolerated.  Avoid milk products until well.   If symptoms become significantly worse during the night or over the weekend, proceed to the local emergency room.     ED Prescriptions   None       Lattie Haw, MD 04/14/22 1546

## 2022-04-12 NOTE — Discharge Instructions (Addendum)
Begin Pedialyte for about 8 hours until diarrhea stops, then switch to clear liquids (apple juice, clear grape juice, Jello, etc) for about 6 to 8 hours.  When improved, advance to a [employer] (Bananas, Rice, Applesauce, Toast). Then gradually resume a regular diet when tolerated.  Avoid milk products until well.   If symptoms become significantly worse during the night or over the weekend, proceed to the local emergency room.

## 2022-04-12 NOTE — ED Triage Notes (Signed)
Patient c/o diarrhea x 3 days, nausea, vomiting and fever.  Patient is feeling somewhat better, feels like it was food poisoning.  Did take some Ibuprofen for fever.  Patient needs a note for work.

## 2023-02-17 ENCOUNTER — Ambulatory Visit
Admission: EM | Admit: 2023-02-17 | Discharge: 2023-02-17 | Disposition: A | Discharge status: Home / Self Care | Payer: Federal, State, Local not specified - PPO | Attending: Family Medicine | Admitting: Family Medicine

## 2023-02-17 DIAGNOSIS — J069 Acute upper respiratory infection, unspecified: Secondary | ICD-10-CM

## 2023-02-17 DIAGNOSIS — R059 Cough, unspecified: Secondary | ICD-10-CM

## 2023-02-17 DIAGNOSIS — R030 Elevated blood-pressure reading, without diagnosis of hypertension: Secondary | ICD-10-CM

## 2023-02-17 DIAGNOSIS — R509 Fever, unspecified: Secondary | ICD-10-CM

## 2023-02-17 LAB — POC SARS CORONAVIRUS 2 AG -  ED: SARS Coronavirus 2 Ag: NEGATIVE

## 2023-02-17 MED ORDER — BENZONATATE 200 MG PO CAPS: 200.0000 mg | ORAL_CAPSULE | Freq: Three times a day (TID) | ORAL | 0 refills | Status: AC | PRN

## 2023-02-17 MED ORDER — AZITHROMYCIN 250 MG PO TABS: 250.0000 mg | ORAL_TABLET | Freq: Every day | ORAL | 0 refills | Status: AC

## 2023-02-17 NOTE — ED Provider Notes (Signed)
[facility]    CSN: [id number] Arrival date & time: 02/17/23  0903      History   Chief Complaint Chief Complaint  Patient presents with   Fever    HPI Jeremiah Richardson is a 62 y.o. male.   HPI Pleasant 62 year old male presents with dry cough, congestion and fever for 3 days.  Patient reports fever of 101.0 yesterday.  PMH significant for elevated blood pressure without diagnosis of hypertension.  History reviewed. No pertinent past medical history.  There are no problems to display for this patient.   Past Surgical History:  Procedure Laterality Date   HERNIA REPAIR         Home Medications    Prior to Admission medications   Medication Sig Start Date End Date Taking? Authorizing Provider  azithromycin (ZITHROMAX) 250 MG tablet Take 1 tablet (250 mg total) by mouth daily. Take first 2 tablets together, then 1 every day until finished. 02/17/23  Yes Trevor Iha, FNP  benzonatate (TESSALON) 200 MG capsule Take 1 capsule (200 mg total) by mouth 3 (three) times daily as needed for up to 7 days. 02/17/23 02/24/23 Yes Trevor Iha, FNP    Family History Family History  Problem Relation Age of Onset   Cancer Father        liver   Diabetes Father     Social History Social History   Tobacco Use   Smoking status: Never   Smokeless tobacco: Current  Vaping Use   Vaping status: Every Day  Substance Use Topics   Alcohol use: No   Drug use: No     Allergies   Patient has no known allergies.   Review of Systems Review of Systems  Constitutional:  Positive for fever.  HENT:  Positive for congestion and sneezing.   Respiratory:  Positive for cough.   All other systems reviewed and are negative.    Physical Exam Triage Vital Signs ED Triage Vitals  Encounter Vitals Group     BP 02/17/23 0918 (!) 194/103     Systolic BP Percentile --      Diastolic BP Percentile --      Pulse Rate 02/17/23 0918 87     Resp 02/17/23 0918 20     Temp  02/17/23 0918 98.5 F (36.9 C)     Temp Source 02/17/23 0918 Oral     SpO2 02/17/23 0918 97 %     Weight --      Height --      Head Circumference --      Peak Flow --      Pain Score 02/17/23 0919 0     Pain Loc --      Pain Education --      Exclude from Growth Chart --    No data found.  Updated Vital Signs BP (!) 166/113 (BP Location: Right Arm)   Pulse 87   Temp 98.5 F (36.9 C) (Oral)   Resp 20   SpO2 97%    Physical Exam Vitals and nursing note reviewed.  Constitutional:      General: He is not in acute distress.    Appearance: Normal appearance. He is obese. He is not ill-appearing.  HENT:     Head: Normocephalic and atraumatic.     Right Ear: Tympanic membrane, ear canal and external ear normal.     Left Ear: Tympanic membrane, ear canal and external ear normal.     Mouth/Throat:     Mouth:  Mucous membranes are moist.     Pharynx: Oropharynx is clear.  Eyes:     Extraocular Movements: Extraocular movements intact.     Conjunctiva/sclera: Conjunctivae normal.     Pupils: Pupils are equal, round, and reactive to light.  Cardiovascular:     Rate and Rhythm: Normal rate and regular rhythm.     Pulses: Normal pulses.     Heart sounds: Normal heart sounds. No murmur heard.    No friction rub. No gallop.     Comments: Hypertensive Pulmonary:     Effort: Pulmonary effort is normal.     Breath sounds: Normal breath sounds. No wheezing, rhonchi or rales.     Comments: Infrequent nonproductive cough noted on exam Musculoskeletal:        General: Normal range of motion.     Cervical back: Normal range of motion and neck supple. No tenderness.  Lymphadenopathy:     Cervical: No cervical adenopathy.  Skin:    General: Skin is warm and dry.  Neurological:     General: No focal deficit present.     Mental Status: He is alert and oriented to person, place, and time. Mental status is at baseline.  Psychiatric:        Mood and Affect: Mood normal.        Behavior:  Behavior normal.        Thought Content: Thought content normal.      UC Treatments / Results  Labs (all labs ordered are listed, but only abnormal results are displayed) Labs Reviewed  POC SARS CORONAVIRUS 2 AG -  ED    EKG   Radiology No results found.  Procedures Procedures (including critical care time)  Medications Ordered in UC Medications - No data to display  Initial Impression / Assessment and Plan / UC Course  I have reviewed the triage vital signs and the nursing notes.  Pertinent labs & imaging results that were available during my care of the patient were reviewed by me and considered in my medical decision making (see chart for details).    MDM: 1.  Elevated blood-pressure reading in office without diagnosis of hypertension-second blood pressure reading 166/113.  Advised patient to take blood pressure in the morning prior to eating and to log measurements so that new PCP can determine daily blood pressure trends.  [facility] primary care provider contact information provided with AVS today. 2.  Acute URI-Rx'd Zithromax (500 mg day 1, 250 mg daily x 4 days); 3.  Cough, unspecified type-Rx Tessalon 200 mg capsules 3 times daily, as needed; 4.  Fever, unspecified-POCT COVID-19 negative today in clinic.  Advised patient may take OTC Tylenol 1 g every 6 hours for fever (oral temperature greater than 100.3). Advised patient COVID-19 was negative in clinic this morning.  Advised patient to take medications as directed with food to completion.  Advised may take Tessalon capsules daily or as needed for cough.  Advised may take OTC Tylenol 1 g every 6 hours for fever (oral temperature greater than 100.3).  Encouraged to increase daily water intake to 64 ounces per day 7 days/week.  Advised/encouraged patient to take blood pressure daily in the morning prior to eating breakfast and to log blood pressure measurements for the next 10 days so that PCP can better understand daily blood  pressure trends.  Advised if symptoms worsen and/or unresolved please follow-up with PCP or here for further evaluation.  Patient discharged home, hemodynamically stable. Final Clinical Impressions(s) / UC  Diagnoses   Final diagnoses:  Fever, unspecified  Acute URI  Cough, unspecified type  Elevated blood pressure reading in office without diagnosis of hypertension     Discharge Instructions      Advised patient COVID-19 was negative in clinic this morning.  Advised patient to take medications as directed with food to completion.  Advised may take Tessalon capsules daily or as needed for cough.  Advised may take OTC Tylenol 1 g every 6 hours for fever (oral temperature greater than 100.3).  Encouraged to increase daily water intake to 64 ounces per day 7 days/week.  Advised/encouraged patient to take blood pressure daily in the morning prior to eating breakfast and to log blood pressure measurements for the next 10 days so that PCP can better understand daily blood pressure trends.  Advised if symptoms worsen and/or unresolved please follow-up with PCP or here for further evaluation.     ED Prescriptions     Medication Sig Dispense Auth. Provider   azithromycin (ZITHROMAX) 250 MG tablet Take 1 tablet (250 mg total) by mouth daily. Take first 2 tablets together, then 1 every day until finished. 6 tablet Trevor Iha, FNP   benzonatate (TESSALON) 200 MG capsule Take 1 capsule (200 mg total) by mouth 3 (three) times daily as needed for up to 7 days. 40 capsule Trevor Iha, FNP      PDMP not reviewed this encounter.   Trevor Iha, FNP 02/17/23 1059

## 2023-02-17 NOTE — ED Triage Notes (Signed)
Pt c/o fever of 101 yesterday took ibuprofen with relief. C/o sneezing, dry cough and congestion since Saturday evening.

## 2023-02-17 NOTE — Discharge Instructions (Addendum)
Advised patient COVID-19 was negative in clinic this morning.  Advised patient to take medications as directed with food to completion.  Advised may take Tessalon capsules daily or as needed for cough.  Advised may take OTC Tylenol 1 g every 6 hours for fever (oral temperature greater than 100.3).  Encouraged to increase daily water intake to 64 ounces per day 7 days/week.  Advised/encouraged patient to take blood pressure daily in the morning prior to eating breakfast and to log blood pressure measurements for the next 10 days so that PCP can better understand daily blood pressure trends.  Advised if symptoms worsen and/or unresolved please follow-up with PCP or here for further evaluation.
